# Patient Record
Sex: Female | Born: 1990 | Race: Black or African American | Hispanic: No | Marital: Single | Smoking: Never smoker
Health system: Southern US, Community
[De-identification: ages and names within clinical notes are randomized; demographics above are authoritative.]

## PROBLEM LIST (undated history)

## (undated) DIAGNOSIS — F988 Other specified behavioral and emotional disorders with onset usually occurring in childhood and adolescence: Secondary | ICD-10-CM

## (undated) DIAGNOSIS — J45909 Unspecified asthma, uncomplicated: Secondary | ICD-10-CM

---

## 2015-05-28 ENCOUNTER — Emergency Department: Payer: Worker's Compensation

## 2015-05-28 ENCOUNTER — Emergency Department
Admission: EM | Admit: 2015-05-28 | Discharge: 2015-05-28 | Disposition: A | Discharge status: Home / Self Care | Payer: Worker's Compensation | Attending: Emergency Medicine | Admitting: Emergency Medicine

## 2015-05-28 ENCOUNTER — Encounter: Payer: Self-pay | Admitting: *Deleted

## 2015-05-28 DIAGNOSIS — W010XXA Fall on same level from slipping, tripping and stumbling without subsequent striking against object, initial encounter: Secondary | ICD-10-CM | POA: Insufficient documentation

## 2015-05-28 DIAGNOSIS — Y93E5 Activity, floor mopping and cleaning: Secondary | ICD-10-CM | POA: Insufficient documentation

## 2015-05-28 DIAGNOSIS — S161XXA Strain of muscle, fascia and tendon at neck level, initial encounter: Secondary | ICD-10-CM

## 2015-05-28 DIAGNOSIS — S300XXA Contusion of lower back and pelvis, initial encounter: Secondary | ICD-10-CM | POA: Insufficient documentation

## 2015-05-28 DIAGNOSIS — Y9289 Other specified places as the place of occurrence of the external cause: Secondary | ICD-10-CM | POA: Insufficient documentation

## 2015-05-28 DIAGNOSIS — Y990 Civilian activity done for income or pay: Secondary | ICD-10-CM | POA: Insufficient documentation

## 2015-05-28 DIAGNOSIS — Z880 Allergy status to penicillin: Secondary | ICD-10-CM | POA: Insufficient documentation

## 2015-05-28 DIAGNOSIS — S233XXA Sprain of ligaments of thoracic spine, initial encounter: Secondary | ICD-10-CM | POA: Insufficient documentation

## 2015-05-28 DIAGNOSIS — S239XXA Sprain of unspecified parts of thorax, initial encounter: Secondary | ICD-10-CM

## 2015-05-28 MED ORDER — IBUPROFEN 600 MG PO TABS: 600.0000 mg | ORAL_TABLET | Freq: Three times a day (TID) | ORAL | Status: AC | PRN

## 2015-05-28 MED ORDER — DIAZEPAM 2 MG PO TABS: 2.0000 mg | ORAL_TABLET | Freq: Three times a day (TID) | ORAL | Status: AC | PRN

## 2015-05-28 MED ORDER — IBUPROFEN 600 MG PO TABS
600.0000 mg | ORAL_TABLET | Freq: Once | ORAL | Status: AC
  Administered 2015-05-28: 600 mg via ORAL
  Filled 2015-05-28: qty 1

## 2015-05-28 MED ORDER — DIAZEPAM 2 MG PO TABS
2.0000 mg | ORAL_TABLET | Freq: Once | ORAL | Status: AC
  Administered 2015-05-28: 2 mg via ORAL
  Filled 2015-05-28: qty 1

## 2015-05-28 NOTE — Discharge Instructions (Signed)
Follow-up with [facility] clinic if any continued problems. Begin taking medication Take paperwork to your supervisor since this happened at work.

## 2015-05-28 NOTE — ED Notes (Addendum)
Left message for supervisor Lucila Maine [phone].

## 2015-05-28 NOTE — ED Provider Notes (Signed)
[facility] Emergency Department Provider Note  ____________________________________________  Time seen: Approximately 9:38 PM  I have reviewed the triage vital signs and the nursing notes.   HISTORY  Chief Complaint Back Pain; Shoulder Injury; and Fall   HPI Diana Sloan is a 25 y.o. female is here with complaint of pain neck, and her neck, and lower back pain after falling at work Sunday night. 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 She did not hit her head and there was no loss of consciousness. She has continued to have cervical and upper thoracic pain. She has not taken any over-the-counter medication at home since this accident. She denies any nausea, vomiting, or visual changes. She denies any paresthesias in her upper or lower extremities. Patient is continued to be ambulatory since. Patient wishes to file as workman's comp. Currently she rates her pain as 6/10.   No past medical history on file.  There are no active problems to display for this patient.   No past surgical history on file.  Current Outpatient Rx  Name  Route  Sig  Dispense  Refill  . diazepam (VALIUM) 2 MG tablet   Oral   Take 1 tablet (2 mg total) by mouth every 8 (eight) hours as needed for muscle spasms.   9 tablet   0   . ibuprofen (ADVIL,MOTRIN) 600 MG tablet   Oral   Take 1 tablet (600 mg total) by mouth every 8 (eight) hours as needed.   30 tablet   0     Allergies Penicillins  No family history on file.  Social History Social History  Substance Use Topics  . Smoking status: Never Smoker   . Smokeless tobacco: Not on file  . Alcohol Use: No    Review of Systems Constitutional: No fever/chills Eyes: No visual changes. ENT: No trauma Cardiovascular: Denies chest pain. Respiratory: Denies shortness of breath. Gastrointestinal: No abdominal pain.  No nausea, no vomiting.   Genitourinary: Negative for  dysuria. Musculoskeletal: Positive cervical, thoracic and lumbar pain. Skin: Negative for rash. Neurological: Negative for headaches, focal weakness or numbness.  10-point ROS otherwise negative.  ____________________________________________   PHYSICAL EXAM:  VITAL SIGNS: ED Triage Vitals  Enc Vitals Group     BP 05/28/15 2113 105/68 mmHg     Pulse Rate 05/28/15 2113 91     Resp 05/28/15 2113 18     Temp 05/28/15 2113 98.8 F (37.1 C)     Temp Source 05/28/15 2113 Oral     SpO2 05/28/15 2113 99 %     Weight 05/28/15 2113 197 lb (89.359 kg)     Height 05/28/15 2113  (1.676 m)     Head Cir --      Peak Flow --      Pain Score 05/28/15 2114 6     Pain Loc --      Pain Edu? --      Excl. in GC? --     Constitutional: Alert and oriented. Well appearing and in no acute distress. Eyes: Conjunctivae are normal. PERRL. EOMI. Head: Atraumatic. Nose: No congestion/rhinnorhea. Neck: No stridor.  Patient arrived to the exam room in a cervical collar. Collar was removed and replaced after patient had point tenderness on palpation of C4-6. No gross deformity or soft tissue edema was noted. Cardiovascular: Normal rate, regular rhythm. Grossly normal heart sounds.  Good peripheral circulation. Respiratory: Normal respiratory effort.  No  retractions. Lungs CTAB. Gastrointestinal: Soft and nontender. No distention.  Musculoskeletal: Moderate tenderness on palpation of the thoracic spine and soft tissue paravertebral muscles bilaterally. No gross deformity or ecchymosis was noted. There is also tenderness on palpation of the sacrum and paravertebral muscles bilaterally. Range of motion is guarded secondary to pain. Patient was ambulatory in the room. Straight leg raises were approximate 70 with discomfort in the lower back. Neurologic:  Normal speech and language. No gross focal neurologic deficits are appreciated. No gait instability. Reflexes 1+ bilaterally. Skin:  Skin is warm, dry and  intact. No rash noted. No ecchymosis, abrasions or erythema was noted. Psychiatric: Mood and affect are normal. Speech and behavior are normal.  ____________________________________________   LABS (all labs ordered are listed, but only abnormal results are displayed)  Labs Reviewed - No data to display  RADIOLOGY  Thoracic spine x-ray per radiologist no acute fracture. Lumbar spine per radiologist no acute fracture or subluxation. Cervical spine CT shows no acute fracture or subluxation. There is straightening of the normal lordosis suspected muscle spasms.  ____________________________________________   PROCEDURES  Procedure(s) performed: None  Critical Care performed: No  ____________________________________________   INITIAL IMPRESSION / ASSESSMENT AND PLAN / ED COURSE  Pertinent labs & imaging results that were available during my care of the patient were reviewed by me and considered in my medical decision making (see chart for details).  Patient is given ibuprofen 60 mg and diazepam 2 mg by mouth while in the emergency room. She was given a prescription for the same. She is also given a note to take to her supervisor said she is claiming worker's comp stating that she may not lift more than 25 pounds, no strenuous activity, and no operating machinery's or driving while taking medication. ____________________________________________   FINAL CLINICAL IMPRESSION(S) / ED DIAGNOSES  Final diagnoses:  Cervical strain, acute, initial encounter  Lumbar contusion, initial encounter  Thoracic back sprain, initial encounter      Tommi Rumps, PA-C 05/28/15 2306  Rockne Menghini, MD 06/02/15 2105

## 2015-05-28 NOTE — ED Notes (Addendum)
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

## 2015-07-04 ENCOUNTER — Emergency Department
Admission: EM | Admit: 2015-07-04 | Discharge: 2015-07-04 | Disposition: A | Discharge status: Home / Self Care | Payer: Self-pay | Attending: Emergency Medicine | Admitting: Emergency Medicine

## 2015-07-04 ENCOUNTER — Encounter: Payer: Self-pay | Admitting: Emergency Medicine

## 2015-07-04 DIAGNOSIS — N926 Irregular menstruation, unspecified: Secondary | ICD-10-CM | POA: Insufficient documentation

## 2015-07-04 DIAGNOSIS — Z880 Allergy status to penicillin: Secondary | ICD-10-CM | POA: Insufficient documentation

## 2015-07-04 DIAGNOSIS — Z3202 Encounter for pregnancy test, result negative: Secondary | ICD-10-CM | POA: Insufficient documentation

## 2015-07-04 DIAGNOSIS — N390 Urinary tract infection, site not specified: Secondary | ICD-10-CM | POA: Insufficient documentation

## 2015-07-04 HISTORY — DX: Unspecified asthma, uncomplicated: J45.909

## 2015-07-04 LAB — URINALYSIS COMPLETE WITH MICROSCOPIC (ARMC ONLY)
BILIRUBIN URINE: NEGATIVE
GLUCOSE, UA: NEGATIVE mg/dL
KETONES UR: NEGATIVE mg/dL
Nitrite: POSITIVE — AB
PH: 8 (ref 5.0–8.0)
Protein, ur: 100 mg/dL — AB
SPECIFIC GRAVITY, URINE: 1.025 (ref 1.005–1.030)

## 2015-07-04 LAB — PREGNANCY, URINE: Preg Test, Ur: NEGATIVE

## 2015-07-04 MED ORDER — CIPROFLOXACIN HCL 500 MG PO TABS: 500.0000 mg | ORAL_TABLET | Freq: Two times a day (BID) | ORAL | Status: AC

## 2015-07-04 NOTE — ED Provider Notes (Signed)
[facility] Emergency Department Provider Note  ____________________________________________  Time seen: On arrival  I have reviewed the triage vital signs and the nursing notes.   HISTORY  Chief Complaint Late Period    HPI Diana Sloan is a 25 y.o. female who presents with dysuria. She also reports she is not had a period in 2 months. She deniesabdominal pain to me. No fevers or chills. No vaginal discharge    Past Medical History  Diagnosis Date  . Asthma     There are no active problems to display for this patient.   History reviewed. No pertinent past surgical history.  Current Outpatient Rx  Name  Route  Sig  Dispense  Refill  . ciprofloxacin (CIPRO) 500 MG tablet   Oral   Take 1 tablet (500 mg total) by mouth 2 (two) times daily.   20 tablet   0   . diazepam (VALIUM) 2 MG tablet   Oral   Take 1 tablet (2 mg total) by mouth every 8 (eight) hours as needed for muscle spasms.   9 tablet   0   . ibuprofen (ADVIL,MOTRIN) 600 MG tablet   Oral   Take 1 tablet (600 mg total) by mouth every 8 (eight) hours as needed.   30 tablet   0     Allergies Penicillins  History reviewed. No pertinent family history.  Social History Social History  Substance Use Topics  . Smoking status: Never Smoker   . Smokeless tobacco: None  . Alcohol Use: No    Review of Systems  Constitutional: Negative for fever.    Genitourinary: Positive for dysuria, no vaginal discharge Musculoskeletal: Negative for back pain. Skin: Negative for rash. Neurological: Negative for headaches    ____________________________________________   PHYSICAL EXAM:  VITAL SIGNS: ED Triage Vitals  Enc Vitals Group     BP 07/04/15 1239 123/83 mmHg     Pulse Rate 07/04/15 1239 71     Resp 07/04/15 1239 18     Temp 07/04/15 1239 98.4 F (36.9 C)     Temp Source 07/04/15 1239 Oral     SpO2 07/04/15 1239 98 %     Weight 07/04/15 1239 187 lb (84.823 kg)    Height 07/04/15 1239  (1.702 m)     Head Cir --      Peak Flow --      Pain Score 07/04/15 1515 0     Pain Loc --      Pain Edu? --      Excl. in GC? --      Constitutional: Alert and oriented. Well appearing and in no distress. Eyes: Conjunctivae are normal.  ENT   Head: Normocephalic and atraumatic.   Mouth/Throat: Mucous membranes are moist. Cardiovascular: Normal rate, regular rhythm.  Respiratory: Normal respiratory effort without tachypnea nor retractions.  Gastrointestinal: Soft and non-tender in all quadrants. No distention. There is no CVA tenderness. Musculoskeletal: Nontender with normal range of motion in all extremities. Neurologic:  Normal speech and language. No gross focal neurologic deficits are appreciated. Skin:  Skin is warm, dry and intact. No rash noted. Psychiatric: Mood and affect are normal. Patient exhibits appropriate insight and judgment.  ____________________________________________    LABS (pertinent positives/negatives)  Labs Reviewed  URINALYSIS COMPLETEWITH MICROSCOPIC ([facility] ONLY) - Abnormal; Notable for the following:    Color, Urine YELLOW (*)    APPearance CLOUDY (*)    Hgb urine dipstick 2+ (*)    Protein, ur 100 (*)  Nitrite POSITIVE (*)    Leukocytes, UA 2+ (*)    Bacteria, UA RARE (*)    Squamous Epithelial / LPF TOO NUMEROUS TO COUNT (*)    All other components within normal limits  PREGNANCY, URINE  POC URINE PREG, ED    ____________________________________________     ____________________________________________    RADIOLOGY I have personally reviewed any xrays that were ordered on this patient: None  ____________________________________________   PROCEDURES  Procedure(s) performed: none   ____________________________________________   INITIAL IMPRESSION / ASSESSMENT AND PLAN / ED COURSE  Pertinent labs & imaging results that were available during my care of the patient were reviewed by me  and considered in my medical decision making (see chart for details).  Patient well-appearing and in no distress. Vital signs are reassuring. Benign exam. Urinalysis consistent with UTI. Pregnancy test negative. We will treat with ciprofloxacin by mouth  ____________________________________________   FINAL CLINICAL IMPRESSION(S) / ED DIAGNOSES  Final diagnoses:  UTI (lower urinary tract infection)     Jene Everyobert Tywana Robotham, MD 07/04/15 [phone]

## 2015-07-04 NOTE — ED Notes (Signed)
Pt reports has missed her period last two months and just doesn't feel well.  Has has some suprapubic burning.

## 2015-07-04 NOTE — ED Notes (Signed)
POC preg negative.

## 2015-07-04 NOTE — Discharge Instructions (Signed)

## 2015-10-30 ENCOUNTER — Emergency Department
Admission: EM | Admit: 2015-10-30 | Discharge: 2015-10-30 | Disposition: A | Discharge status: Home / Self Care | Payer: Self-pay | Attending: Emergency Medicine | Admitting: Emergency Medicine

## 2015-10-30 ENCOUNTER — Encounter: Payer: Self-pay | Admitting: Emergency Medicine

## 2015-10-30 ENCOUNTER — Emergency Department: Payer: Self-pay

## 2015-10-30 DIAGNOSIS — K529 Noninfective gastroenteritis and colitis, unspecified: Secondary | ICD-10-CM | POA: Insufficient documentation

## 2015-10-30 DIAGNOSIS — J45909 Unspecified asthma, uncomplicated: Secondary | ICD-10-CM | POA: Insufficient documentation

## 2015-10-30 LAB — LIPASE, BLOOD: Lipase: 15 U/L (ref 11–51)

## 2015-10-30 LAB — COMPREHENSIVE METABOLIC PANEL
ALK PHOS: 80 U/L (ref 38–126)
ALT: 35 U/L (ref 14–54)
ANION GAP: 11 (ref 5–15)
AST: 38 U/L (ref 15–41)
Albumin: 4.2 g/dL (ref 3.5–5.0)
BILIRUBIN TOTAL: 0.4 mg/dL (ref 0.3–1.2)
BUN: 10 mg/dL (ref 6–20)
CALCIUM: 9.8 mg/dL (ref 8.9–10.3)
CO2: 29 mmol/L (ref 22–32)
Chloride: 101 mmol/L (ref 101–111)
Creatinine, Ser: 0.82 mg/dL (ref 0.44–1.00)
GFR calc Af Amer: >60 mL/min (ref >60)
GLUCOSE: 100 mg/dL — AB (ref 65–99)
POTASSIUM: 4.1 mmol/L (ref 3.5–5.1)
Sodium: 141 mmol/L (ref 135–145)
TOTAL PROTEIN: 7.6 g/dL (ref 6.5–8.1)

## 2015-10-30 LAB — URINALYSIS COMPLETE WITH MICROSCOPIC (ARMC ONLY)
BACTERIA UA: NONE SEEN
Bilirubin Urine: NEGATIVE
GLUCOSE, UA: NEGATIVE mg/dL
HGB URINE DIPSTICK: NEGATIVE
LEUKOCYTES UA: NEGATIVE
NITRITE: NEGATIVE
Protein, ur: NEGATIVE mg/dL
SPECIFIC GRAVITY, URINE: 1.025 (ref 1.005–1.030)
pH: 5 (ref 5.0–8.0)

## 2015-10-30 LAB — CBC
HEMATOCRIT: 41.7 % (ref 35.0–47.0)
HEMOGLOBIN: 14.6 g/dL (ref 12.0–16.0)
MCH: 32 pg (ref 26.0–34.0)
MCHC: 35.1 g/dL (ref 32.0–36.0)
MCV: 91.3 fL (ref 80.0–100.0)
Platelets: 237 10*3/uL (ref 150–440)
RBC: 4.57 MIL/uL (ref 3.80–5.20)
RDW: 14 % (ref 11.5–14.5)
WBC: 10.9 10*3/uL (ref 3.6–11.0)

## 2015-10-30 LAB — PREGNANCY, URINE: Preg Test, Ur: NEGATIVE

## 2015-10-30 MED ORDER — ONDANSETRON 4 MG PO TBDP
4.0000 mg | ORAL_TABLET | Freq: Once | ORAL | Status: AC | PRN
  Administered 2015-10-30: 4 mg via ORAL
  Filled 2015-10-30: qty 1

## 2015-10-30 MED ORDER — MORPHINE SULFATE (PF) 2 MG/ML IV SOLN
2.0000 mg | Freq: Once | INTRAVENOUS | Status: AC
Start: 2015-10-30 — End: 2015-10-30
  Administered 2015-10-30: 2 mg via INTRAVENOUS
  Filled 2015-10-30: qty 1

## 2015-10-30 MED ORDER — DIATRIZOATE MEGLUMINE & SODIUM 66-10 % PO SOLN
15.0000 mL | Freq: Once | ORAL | Status: AC
  Administered 2015-10-30: 15 mL via ORAL

## 2015-10-30 MED ORDER — IOPAMIDOL (ISOVUE-300) INJECTION 61%
100.0000 mL | Freq: Once | INTRAVENOUS | Status: AC | PRN
  Administered 2015-10-30: 100 mL via INTRAVENOUS

## 2015-10-30 MED ORDER — ONDANSETRON HCL 4 MG PO TABS: 4.0000 mg | ORAL_TABLET | Freq: Three times a day (TID) | ORAL | 0 refills | Status: AC | PRN

## 2015-10-30 MED ORDER — ONDANSETRON HCL 4 MG/2ML IJ SOLN
4.0000 mg | Freq: Once | INTRAMUSCULAR | Status: AC
  Administered 2015-10-30: 4 mg via INTRAVENOUS
  Filled 2015-10-30: qty 2

## 2015-10-30 NOTE — ED Notes (Signed)
Pt stated she was finished with contrast, CT notified. Will come pick her up when labs are back.

## 2015-10-30 NOTE — ED Notes (Addendum)
Pt states she has had a cough for a few days. This AM starting vomiting and having diarrhea, states around 0200. States lower abdominal pain on both sides "right before I use the bathroom". Denies fever, denies urinary symptoms. Denies abdominal surgeries.

## 2015-10-30 NOTE — ED Notes (Signed)
Pt informed of delay to CT. Pt verbalized understanding of wait.

## 2015-10-30 NOTE — ED Triage Notes (Signed)
Pt ambulatory to triage with steady gait, pt c/o abdominal pain, vomiting and diarrhea since this morning. Pt denies blood in emesis or commode. Pt denies chest pain or dizziness. Pt alert and oriented x 4, no increased work in breathing noted.

## 2015-10-30 NOTE — ED Notes (Signed)
Calling lab again checking on urine/blood results. Blood was sent down at 0415.

## 2015-10-30 NOTE — ED Notes (Signed)
Called lab checking on urine preg since urine was sent down prior to seeing order. Had previously informed lab about urine preg. They stated they could not see the order, new order put in so lab can run test.

## 2015-10-30 NOTE — ED Notes (Signed)
Called lab about POC urine preg. This RN didn't see order until urine was already sent to lab.

## 2015-10-30 NOTE — ED Notes (Signed)
Dr. Brown at bedside

## 2015-10-30 NOTE — ED Provider Notes (Signed)
[facility] Emergency Department Provider Note  ____________________________________________  Time seen: 4:30 AM  I have reviewed the triage vital signs and the nursing notes.   HISTORY  Chief Complaint Abdominal Pain      HPI Diana Sloan is a 25 y.o. female presents with acute onset of abdominal pain at approximately 1:00 this morning accompanied by vomiting and diarrhea. Patient states the last time she ate was at 9 PM last night when she had serial. Patient denies any fever. Patient states current pain score is 6.5 out of 10   Past Medical History:  Diagnosis Date  . Asthma     There are no active problems to display for this patient.   History reviewed. No pertinent surgical history.   Allergies Penicillins  No family history on file.  Social History Social History  Substance Use Topics  . Smoking status: Never Smoker  . Smokeless tobacco: Never Used  . Alcohol use No    Review of Systems  Constitutional: Negative for fever. Eyes: Negative for visual changes. ENT: Negative for sore throat. Cardiovascular: Negative for chest pain. Respiratory: Negative for shortness of breath. Gastrointestinal: Positive for abdominal pain, vomiting and diarrhea. Genitourinary: Negative for dysuria. Musculoskeletal: Negative for back pain. Skin: Negative for rash. Neurological: Negative for headaches, focal weakness or numbness.   10-point ROS otherwise negative.  ____________________________________________   PHYSICAL EXAM:  VITAL SIGNS: ED Triage Vitals [10/30/15 0335]  Enc Vitals Group     BP 114/73     Pulse Rate 87     Resp 18     Temp 98.2 F (36.8 C)     Temp Source Oral     SpO2 100 %     Weight 197 lb (89.4 kg)     Height 5\' 6"  (1.676 m)     Head Circumference      Peak Flow      Pain Score 7     Pain Loc      Pain Edu?      Excl. in GC?      Constitutional: Alert and oriented. Well appearing and in no  distress. Eyes: Conjunctivae are normal. PERRL. Normal extraocular movements. ENT   Head: Normocephalic and atraumatic.   Nose: No congestion/rhinnorhea.   Mouth/Throat: Mucous membranes are moist.   Neck: No stridor. Hematological/Lymphatic/Immunilogical: No cervical lymphadenopathy. Cardiovascular: Normal rate, regular rhythm. Normal and symmetric distal pulses are present in all extremities. No murmurs, rubs, or gallops. Respiratory: Normal respiratory effort without tachypnea nor retractions. Breath sounds are clear and equal bilaterally. No wheezes/rales/rhonchi. Gastrointestinal: Right Lower quadrant/right upper quadrant pain with palpation.. No distention. There is no CVA tenderness. Genitourinary: deferred Musculoskeletal: Nontender with normal range of motion in all extremities. No joint effusions.  No lower extremity tenderness nor edema. Neurologic:  Normal speech and language. No gross focal neurologic deficits are appreciated. Speech is normal.  Skin:  Skin is warm, dry and intact. No rash noted. Psychiatric: Mood and affect are normal. Speech and behavior are normal. Patient exhibits appropriate insight and judgment.  ____________________________________________    LABS (pertinent positives/negatives)  Labs Reviewed  COMPREHENSIVE METABOLIC PANEL - Abnormal; Notable for the following:       Result Value   Glucose, Bld 100 (*)    All other components within normal limits  URINALYSIS COMPLETEWITH MICROSCOPIC ([facility] ONLY) - Abnormal; Notable for the following:    Color, Urine AMBER (*)    APPearance CLEAR (*)    Ketones, ur TRACE (*)  Squamous Epithelial / LPF 0-5 (*)    All other components within normal limits  LIPASE, BLOOD  CBC  PREGNANCY, URINE  PREGNANCY, URINE  POC URINE PREG, ED          Procedures     INITIAL IMPRESSION / ASSESSMENT AND PLAN / ED COURSE  Pertinent labs & imaging results that were available during my care of the  patient were reviewed by me and considered in my medical decision making (see chart for details).  Patient received morphine 2 mg and Zofran 4 mgResolution of pain and nausea  ____________________________________________   FINAL CLINICAL IMPRESSION(S) / ED DIAGNOSES  Final diagnoses:  Gastroenteritis      Darci Current, MD 10/31/15 [phone]

## 2015-11-03 ENCOUNTER — Emergency Department
Admission: EM | Admit: 2015-11-03 | Discharge: 2015-11-03 | Disposition: A | Discharge status: Home / Self Care | Payer: Self-pay | Attending: Emergency Medicine | Admitting: Emergency Medicine

## 2015-11-03 DIAGNOSIS — N926 Irregular menstruation, unspecified: Secondary | ICD-10-CM | POA: Insufficient documentation

## 2015-11-03 DIAGNOSIS — J45909 Unspecified asthma, uncomplicated: Secondary | ICD-10-CM | POA: Insufficient documentation

## 2015-11-03 LAB — POCT PREGNANCY, URINE: Preg Test, Ur: NEGATIVE

## 2015-11-03 NOTE — ED Notes (Signed)
Pt here for pregnancy verification states has had positive and negative test at home. Pt denies any complaints at this time.

## 2015-11-03 NOTE — ED Provider Notes (Signed)
[facility] Emergency Department Provider Note        Time seen: ----------------------------------------- 9:34 PM on 11/03/2015 -----------------------------------------    I have reviewed the triage vital signs and the nursing notes.   HISTORY  Chief Complaint Possible Pregnancy    HPI Diana Sloan is a 25 y.o. female who presents to ER for possible pregnancy. Patient has not had any symptoms. Reports she has had one positive and 1 negative from proximal chest. She states normally her menstrual cycles are irregular, her last cycle was in June.   Past Medical History:  Diagnosis Date  . Asthma     There are no active problems to display for this patient.   History reviewed. No pertinent surgical history.  Allergies Penicillins  Social History Social History  Substance Use Topics  . Smoking status: Never Smoker  . Smokeless tobacco: Never Used  . Alcohol use No    Review of Systems Constitutional: Negative for fever. Eyes: Negative for visual changes. ENT: Negative for sore throat. Cardiovascular: Negative for chest pain. Respiratory: Negative for shortness of breath. Gastrointestinal: Negative for abdominal pain, vomiting and diarrhea. Genitourinary: Negative for dysuria. Musculoskeletal: Negative for back pain. Skin: Negative for rash. Neurological: Negative for headaches, focal weakness or numbness.  10-point ROS otherwise negative.  ____________________________________________   PHYSICAL EXAM:  VITAL SIGNS: ED Triage Vitals [11/03/15 2119]  Enc Vitals Group     BP 120/80     Pulse Rate 83     Resp 18     Temp 98 F (36.7 C)     Temp Source Oral     SpO2 100 %     Weight 197 lb (89.4 kg)     Height      Head Circumference      Peak Flow      Pain Score      Pain Loc      Pain Edu?      Excl. in GC?     Constitutional: Alert and oriented. Well appearing and in no distress. Eyes: Conjunctivae are normal.  PERRL. Normal extraocular movements. Cardiovascular: Normal rate, regular rhythm. No murmurs, rubs, or gallops. Respiratory: Normal respiratory effort without tachypnea nor retractions. Breath sounds are clear and equal bilaterally. No wheezes/rales/rhonchi. Gastrointestinal: Soft and nontender. Normal bowel sounds Musculoskeletal: Nontender with normal range of motion in all extremities. No lower extremity tenderness nor edema. Neurologic:  Normal speech and language. No gross focal neurologic deficits are appreciated.  Skin:  Skin is warm, dry and intact. No rash noted. Psychiatric: Mood and affect are normal. Speech and behavior are normal.  ____________________________________________  ED COURSE:  Pertinent labs & imaging results that were available during my care of the patient were reviewed by me and considered in my medical decision making (see chart for details). Clinical Course  Patient is in no acute distress, we will obtain a pregnancy test to verify.    Procedures ____________________________________________   LABS (pertinent positives/negatives)  Labs Reviewed  POC URINE PREG, ED  POCT PREGNANCY, URINE  ____________________________________________  FINAL ASSESSMENT AND PLAN  IRREGULAR MENSES  Plan: Patient with labs as dictated above.she's in no acute distress, she had a negative pregnancy test 4 days ago and today. She is stable for outpatient follow-up.  Emily Filbert, MD   Note: This dictation was prepared with Dragon dictation. Any transcriptional errors that result from this process are unintentional    Emily Filbert, MD 11/03/15 2136

## 2015-11-03 NOTE — ED Notes (Signed)
Patient discharged to home per MD order. Patient in stable condition, and deemed medically cleared by ED provider for discharge. Discharge instructions reviewed with patient/family using "Teach Back"; verbalized understanding of medication education and administration, and information about follow-up care. Denies further concerns. ° °

## 2015-11-03 NOTE — ED Triage Notes (Signed)
Pt states that she would ike a pregnancy test. Had one positive and 1 negative. Denies any complaints.

## 2015-11-03 NOTE — ED Triage Notes (Signed)
Patient ambulatory to triage with steady gait, without difficulty or distress noted; pt denies any c/o; st "I had a positive pregnancy test and a negative pregnancy test at home and I just want to confirm it"

## 2015-12-01 ENCOUNTER — Encounter: Payer: Self-pay | Admitting: Occupational Medicine

## 2015-12-01 ENCOUNTER — Emergency Department
Admission: EM | Admit: 2015-12-01 | Discharge: 2015-12-01 | Disposition: A | Discharge status: Home / Self Care | Payer: Self-pay | Attending: Emergency Medicine | Admitting: Emergency Medicine

## 2015-12-01 DIAGNOSIS — Z202 Contact with and (suspected) exposure to infections with a predominantly sexual mode of transmission: Secondary | ICD-10-CM | POA: Insufficient documentation

## 2015-12-01 DIAGNOSIS — J45909 Unspecified asthma, uncomplicated: Secondary | ICD-10-CM | POA: Insufficient documentation

## 2015-12-01 DIAGNOSIS — F909 Attention-deficit hyperactivity disorder, unspecified type: Secondary | ICD-10-CM | POA: Insufficient documentation

## 2015-12-01 HISTORY — DX: Other specified behavioral and emotional disorders with onset usually occurring in childhood and adolescence: F98.8
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
## 2015-12-01 NOTE — ED Notes (Signed)
Discharge instructions reviewed with patient. Questions fielded by this RN. Patient verbalizes understanding of instructions. Patient discharged home in stable condition per Triplett NP. No acute distress noted at time of discharge.   

## 2015-12-01 NOTE — ED Provider Notes (Signed)
[facility] Emergency Department Provider Note  ____________________________________________  Time seen: Approximately 9:46 PM  I have reviewed the triage vital signs and the nursing notes.   HISTORY  Chief Complaint SEXUALLY TRANSMITTED DISEASE    HPI Twania Bujak is a 25 y.o. female who presents to the emergency department requesting an oral swab due to exposure to an STD while having oral sex. She reports having a new sexual partner and states that they had oral sex about a week ago. She denies vaginal or anal intercourse and denies any vaginal discharge or abdominal pain. She reports that her throat is a little "scratchy" but that might be just the thought of possibly having some type of bacteria or infection in her throat. She denies fever.  She denies dysphasia.  Past Medical History:  Diagnosis Date  . ADD (attention deficit disorder)   . Asthma     There are no active problems to display for this patient.   History reviewed. No pertinent surgical history.  Prior to Admission medications   Medication Sig Start Date End Date Taking? Authorizing Provider  diazepam (VALIUM) 2 MG tablet Take 1 tablet (2 mg total) by mouth every 8 (eight) hours as needed for muscle spasms. 05/28/15   Tommi Rumps, PA-C  ibuprofen (ADVIL,MOTRIN) 600 MG tablet Take 1 tablet (600 mg total) by mouth every 8 (eight) hours as needed. 05/28/15   Tommi Rumps, PA-C    Allergies Penicillins  History reviewed. No pertinent family history.  Social History Social History  Substance Use Topics  . Smoking status: Never Smoker  . Smokeless tobacco: Never Used  . Alcohol use No    Review of Systems Constitutional: Negative for fever. Respiratory:  Negative for shortness of breath or cough. ENT: Positive for "scratchy throat." Gastrointestinal:  Negative for abdominal pain;  negative for nausea ,  negative for vomiting. Genitourinary:  Negative for dysuria ,   negative for vaginal discharge. Musculoskeletal:  Negative for back pain. Skin:  Negative for rash, lesion, wound. ____________________________________________   PHYSICAL EXAM:  VITAL SIGNS: ED Triage Vitals  Enc Vitals Group     BP      Pulse      Resp      Temp      Temp src      SpO2      Weight      Height      Head Circumference      Peak Flow      Pain Score      Pain Loc      Pain Edu?      Excl. in GC?     Constitutional: Alert and oriented. Well appearing and in no acute distress. Eyes: Conjunctivae are normal. PERRL. EOMI. Head: Atraumatic. Nose: No congestion/rhinnorhea. Mouth/Throat: Mucous membranes are moist. Tonsils 1+ without exudate. Oropharynx normal appearance. Respiratory: Normal respiratory effort.  No retractions. Gastrointestinal: Abdomen soft, nontender, no rebound or guarding. Genitourinary: Pelvic exam:  Deferred Musculoskeletal: No extremity tenderness nor edema.  Neurologic:  Normal speech and language. No gross focal neurologic deficits are appreciated. Speech is normal. No gait instability. Skin:  Skin is warm, dry and intact. No rash noted. Psychiatric: Mood and affect are normal. Speech and behavior are normal.  ____________________________________________   LABS (all labs ordered are listed, but only abnormal results are displayed)  Labs Reviewed  CHLAMYDIA/NGC RT PCR ([facility] ONLY)   ____________________________________________  RADIOLOGY  not indicated ____________________________________________   PROCEDURES  Procedure(s)  performed:  None  ____________________________________________   INITIAL IMPRESSION / ASSESSMENT AND PLAN / ED COURSE  Clinical Course     Pertinent labs & imaging results that were available during my care of the patient were reviewed by me and considered in my medical decision making (see chart for details).  Oral pharyngeal swab collected. Patient was notified that this is a send out  test and won't be available for a few days. She was instructed to follow-up with the health department. She was advised not to have intercourse until her partner is tested and treated as well. Because she is not having any symptoms today and her exam is reassuring, she will not be given any medications while here nor will she be given any prescriptions. ____________________________________________   FINAL CLINICAL IMPRESSION(S) / ED DIAGNOSES  Final diagnoses:  Potential exposure to STD    Note:  This document was prepared using Dragon voice recognition software and may include unintentional dictation errors.    Chinita PesterCari B Aimar Borghi, FNP 12/01/15 2225    Minna AntisKevin Paduchowski, MD 12/01/15 2235

## 2015-12-04 LAB — CT/GC NAA, PHARYNGEAL
C TRACH RRNA NPH QL PCR: NEGATIVE
N GONORRHOEA RRNA NPH QL PCR: NEGATIVE

## 2015-12-06 ENCOUNTER — Emergency Department: Admission: EM | Admit: 2015-12-06 | Discharge: 2015-12-06 | Payer: Self-pay

## 2017-02-01 IMAGING — CT CT ABD-PELV W/ CM
2 of 8 series · 14 of 46 positions shown, 18 images · IV contrast (APPLIED)
Comparison: None.

CLINICAL DATA: Initial valuation for acute right upper quadrant and
right lower quadrant pain.

EXAM:
CT ABDOMEN AND PELVIS WITH CONTRAST
TECHNIQUE: Multidetector CT imaging of the abdomen and pelvis was performed
using the standard protocol following bolus administration of
intravenous contrast.
CONTRAST:  100mL NURCTV-SOO IOPAMIDOL (NURCTV-SOO) INJECTION 61%

[Series 2: axial st · axial · 0.77mm/px · z∈[-776,-391]mm · 11 of 92 slices shown, 15 images]
[im 10/92  soft-tissue]
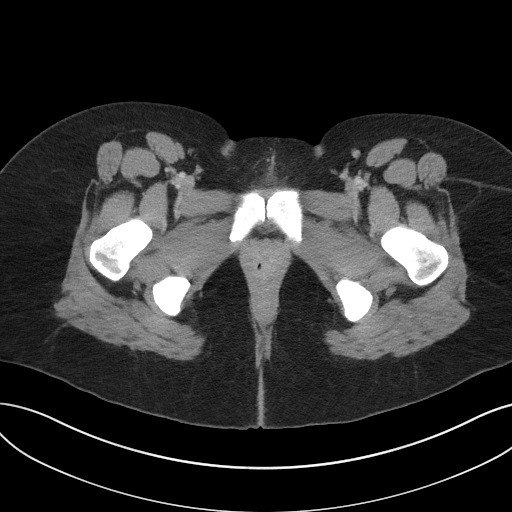
[im 10/92  bone]
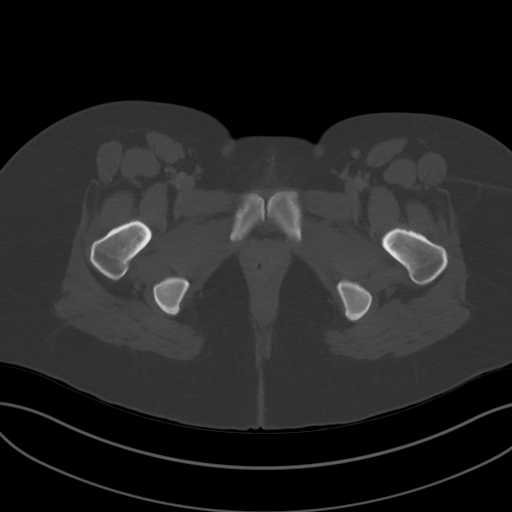
[im 20/92  soft-tissue]
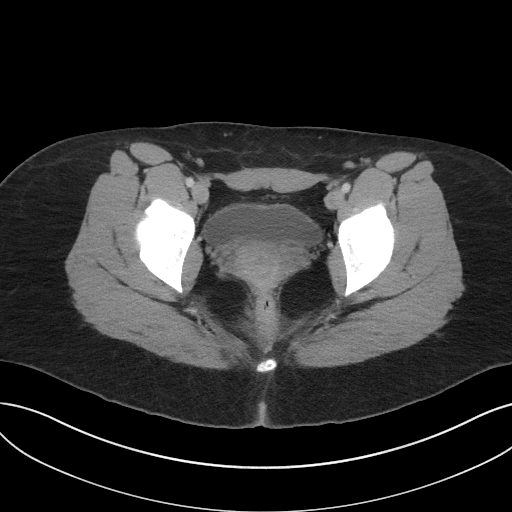
[im 29/92  soft-tissue]
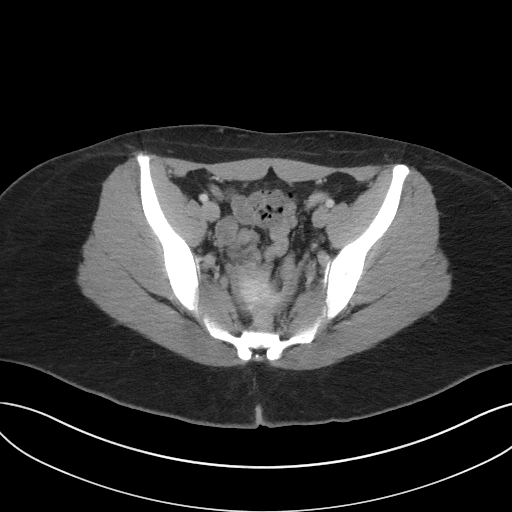
[im 39/92  soft-tissue]
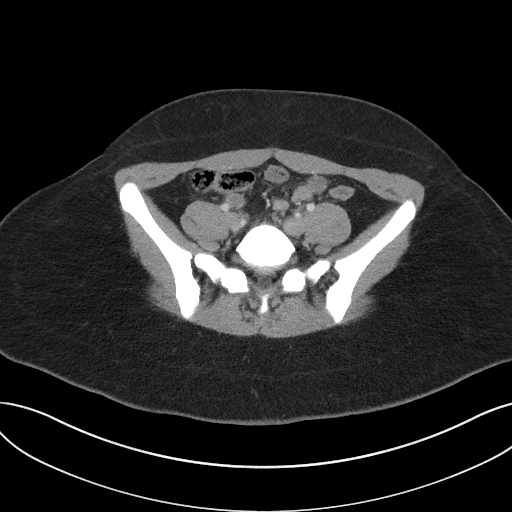
[im 48/92  soft-tissue]
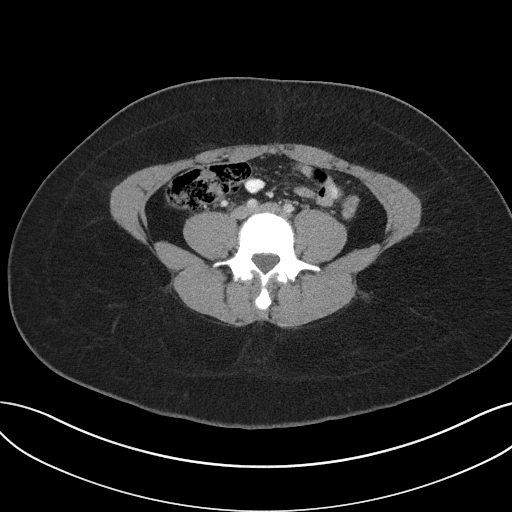
[im 58/92  soft-tissue]
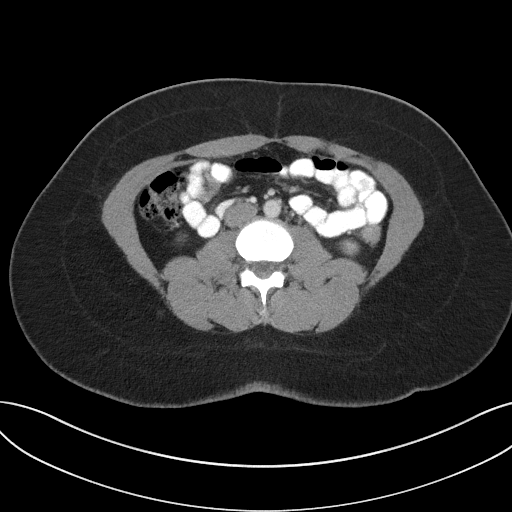
[im 68/92  soft-tissue]
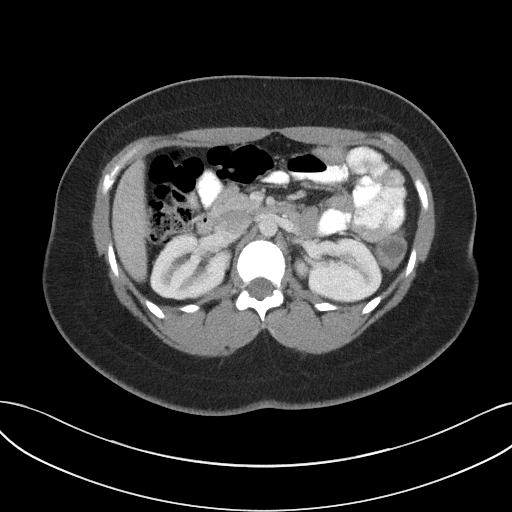
[im 72/92  lung]
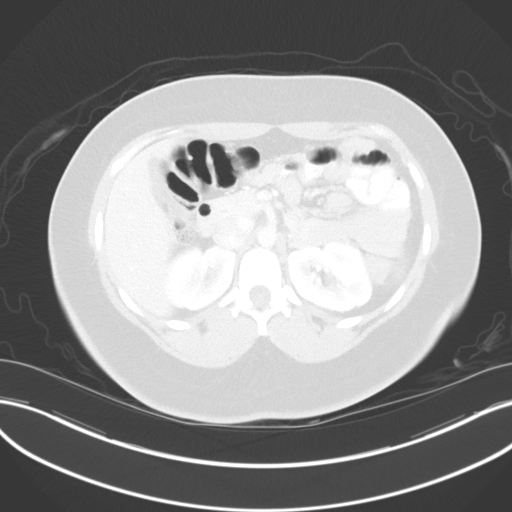
[im 77/92  soft-tissue]
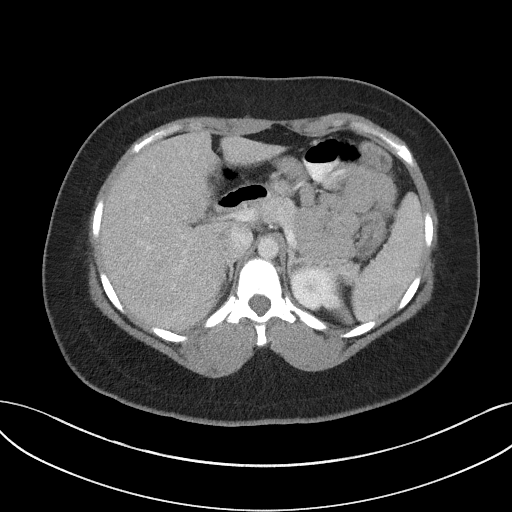
[im 77/92  lung]
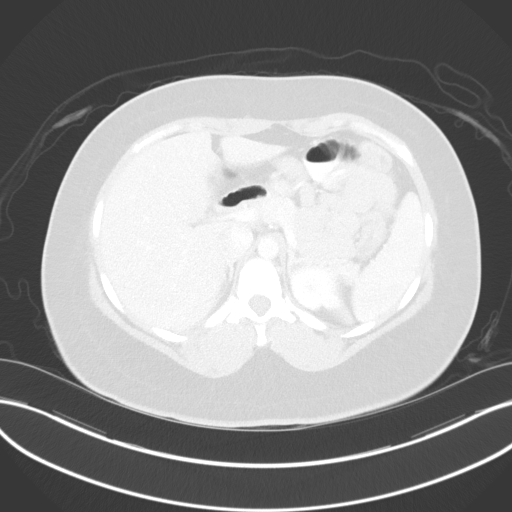
[im 82/92  lung]
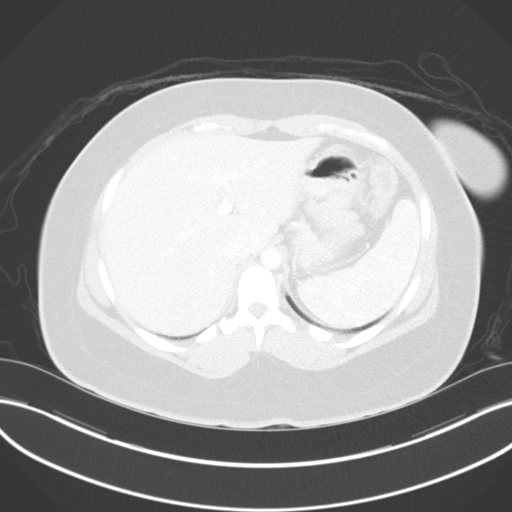
[im 87/92  soft-tissue]
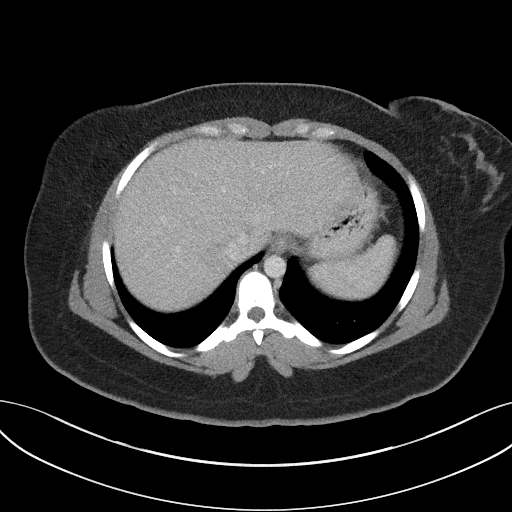
[im 87/92  lung]
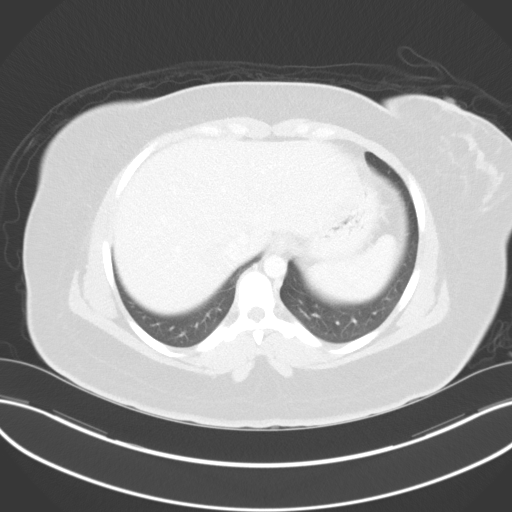
[im 87/92  bone]
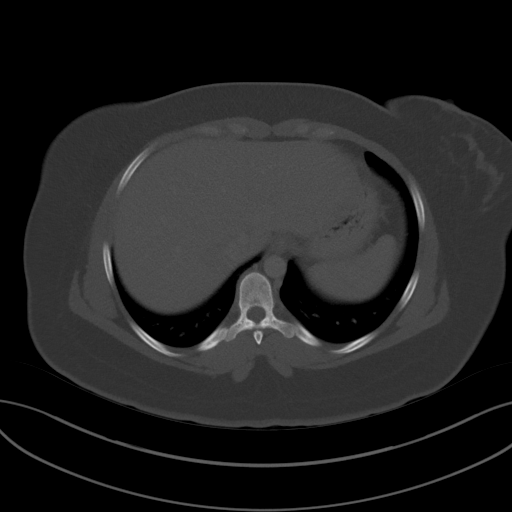

[Series 8: coronal st · coronal · 0.97mm/px · 3 of 80 slices shown]
[im 20/80  soft-tissue]
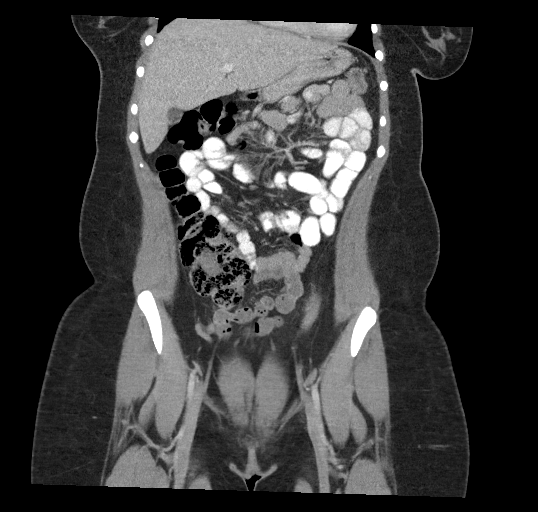
[im 40/80  soft-tissue]
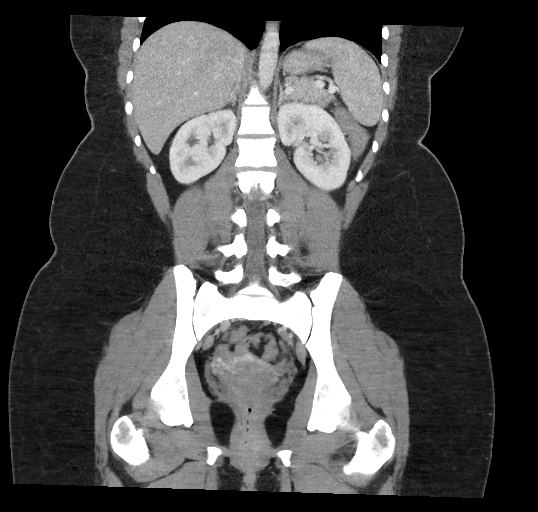
[im 60/80  soft-tissue]
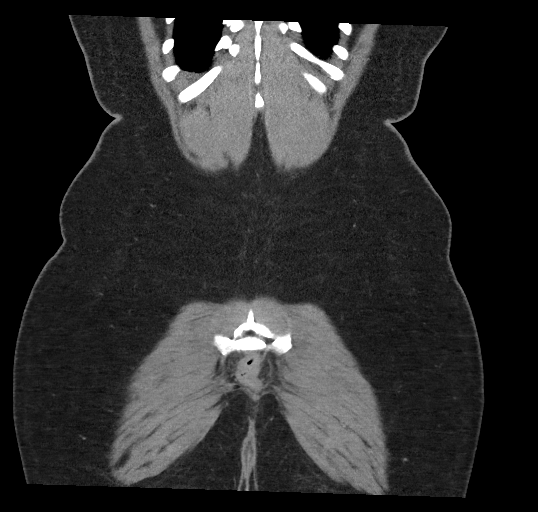

[14 of 46 positions shown; findings below may reference images not displayed]

FINDINGS: Visualized lung bases are clear.

Liver demonstrates a normal contrast enhanced appearance.
Gallbladder normal. No biliary dilatation. Spleen, adrenal glands,
and pancreas demonstrate a normal contrast enhanced appearance.

Kidneys equal in size with symmetric enhancement. No
nephrolithiasis, hydronephrosis, or focal enhancing renal mass.

Stomach within normal limits. No evidence for bowel obstruction.
Appendix well visualized in the right lower quadrant and is of
normal caliber and appearance associated inflammatory changes to
suggest acute appendicitis. Mild circumferential wall thickening
about the descending colon favored to IIb related to
underdistention, although acute colitis could be considered in the
correct clinical setting.

Bladder partially distended without acute abnormality. Uterus within
normal limits. Ovaries unremarkable.

No free fluid or air. No pathologically enlarged lymph nodes
identified within the abdomen and pelvis. Normal intravascular
enhancement seen throughout the intra-abdominal aorta and its branch
vessels.

No acute osseous abnormality. No worrisome lytic or blastic osseous
lesions. Degenerative disc bulging noted at L4-5 and L5-S1.
IMPRESSION: 1. Mild circumferential wall thickening about the descending colon.
This finding is favored to be related to incomplete distension,
particularly in this patient with right-sided abdominal pain,
however, possible mild colitis could also have this appearance in
the correct clinical setting.
2. No other acute intra-abdominal or pelvic process.
3. Normal appendix.
